# Patient Record
Sex: Male | Born: 1950 | Race: White | Hispanic: No | Marital: Married | Smoking: Never smoker
Health system: Southern US, Community
[De-identification: ages and names within clinical notes are randomized; demographics above are authoritative.]

## PROBLEM LIST (undated history)

## (undated) DIAGNOSIS — E669 Obesity, unspecified: Secondary | ICD-10-CM

## (undated) DIAGNOSIS — H9190 Unspecified hearing loss, unspecified ear: Secondary | ICD-10-CM

## (undated) DIAGNOSIS — I10 Essential (primary) hypertension: Secondary | ICD-10-CM

## (undated) DIAGNOSIS — G609 Hereditary and idiopathic neuropathy, unspecified: Secondary | ICD-10-CM

## (undated) DIAGNOSIS — I219 Acute myocardial infarction, unspecified: Secondary | ICD-10-CM

## (undated) DIAGNOSIS — G894 Chronic pain syndrome: Secondary | ICD-10-CM

## (undated) DIAGNOSIS — E119 Type 2 diabetes mellitus without complications: Secondary | ICD-10-CM

## (undated) DIAGNOSIS — E785 Hyperlipidemia, unspecified: Secondary | ICD-10-CM

## (undated) DIAGNOSIS — F411 Generalized anxiety disorder: Secondary | ICD-10-CM

## (undated) DIAGNOSIS — G4733 Obstructive sleep apnea (adult) (pediatric): Secondary | ICD-10-CM

## (undated) DIAGNOSIS — I25119 Atherosclerotic heart disease of native coronary artery with unspecified angina pectoris: Secondary | ICD-10-CM

## (undated) DIAGNOSIS — K5900 Constipation, unspecified: Secondary | ICD-10-CM

## (undated) HISTORY — DX: Hereditary and idiopathic neuropathy, unspecified: G60.9

## (undated) HISTORY — PX: HAND SURGERY: SHX662

## (undated) HISTORY — DX: Generalized anxiety disorder: F41.1

## (undated) HISTORY — DX: Essential (primary) hypertension: I10

## (undated) HISTORY — PX: EYE SURGERY: SHX253

## (undated) HISTORY — DX: Unspecified hearing loss, unspecified ear: H91.90

## (undated) HISTORY — DX: Acute myocardial infarction, unspecified: I21.9

## (undated) HISTORY — DX: Atherosclerotic heart disease of native coronary artery with unspecified angina pectoris: I25.119

## (undated) HISTORY — DX: Hyperlipidemia, unspecified: E78.5

## (undated) HISTORY — DX: Type 2 diabetes mellitus without complications: E11.9

## (undated) HISTORY — PX: SHOULDER SURGERY: SHX246

## (undated) HISTORY — PX: CARPAL TUNNEL RELEASE: SHX101

## (undated) HISTORY — DX: Obstructive sleep apnea (adult) (pediatric): G47.33

## (undated) HISTORY — DX: Constipation, unspecified: K59.00

## (undated) HISTORY — DX: Obesity, unspecified: E66.9

## (undated) HISTORY — DX: Chronic pain syndrome: G89.4

---

## 1999-08-13 ENCOUNTER — Emergency Department (HOSPITAL_COMMUNITY): Admission: EM | Admit: 1999-08-13 | Discharge: 1999-08-13 | Payer: Self-pay | Admitting: Emergency Medicine

## 1999-08-13 ENCOUNTER — Encounter: Payer: Self-pay | Admitting: Emergency Medicine

## 2000-08-25 ENCOUNTER — Inpatient Hospital Stay (HOSPITAL_COMMUNITY): Admission: EM | Admit: 2000-08-25 | Discharge: 2000-09-01 | Payer: Self-pay | Admitting: Emergency Medicine

## 2004-05-17 ENCOUNTER — Encounter: Admission: RE | Admit: 2004-05-17 | Discharge: 2004-05-17 | Payer: Self-pay | Admitting: Urology

## 2004-05-18 ENCOUNTER — Ambulatory Visit (HOSPITAL_COMMUNITY): Admission: RE | Admit: 2004-05-18 | Discharge: 2004-05-18 | Payer: Self-pay | Admitting: Urology

## 2004-05-18 ENCOUNTER — Ambulatory Visit (HOSPITAL_BASED_OUTPATIENT_CLINIC_OR_DEPARTMENT_OTHER): Admission: RE | Admit: 2004-05-18 | Discharge: 2004-05-18 | Payer: Self-pay | Admitting: Urology

## 2004-05-23 ENCOUNTER — Observation Stay (HOSPITAL_COMMUNITY): Admission: EM | Admit: 2004-05-23 | Discharge: 2004-05-24 | Payer: Self-pay | Admitting: Urology

## 2005-03-05 IMAGING — CR DG CHEST 2V
2 series · 2 of 2 positions shown · non-contrast
Comparison: none

CLINICAL DATA: Pre-op respiratory exam for surgery for phimosis. 
 CHEST X-RAY: 
 Two views of the chest show the lungs to be clear.  Slightly prominent perihilar markings are noted.  Mild cardiomegaly is present.  No bony abnormality is seen.

[view not recorded (1 of 2)]
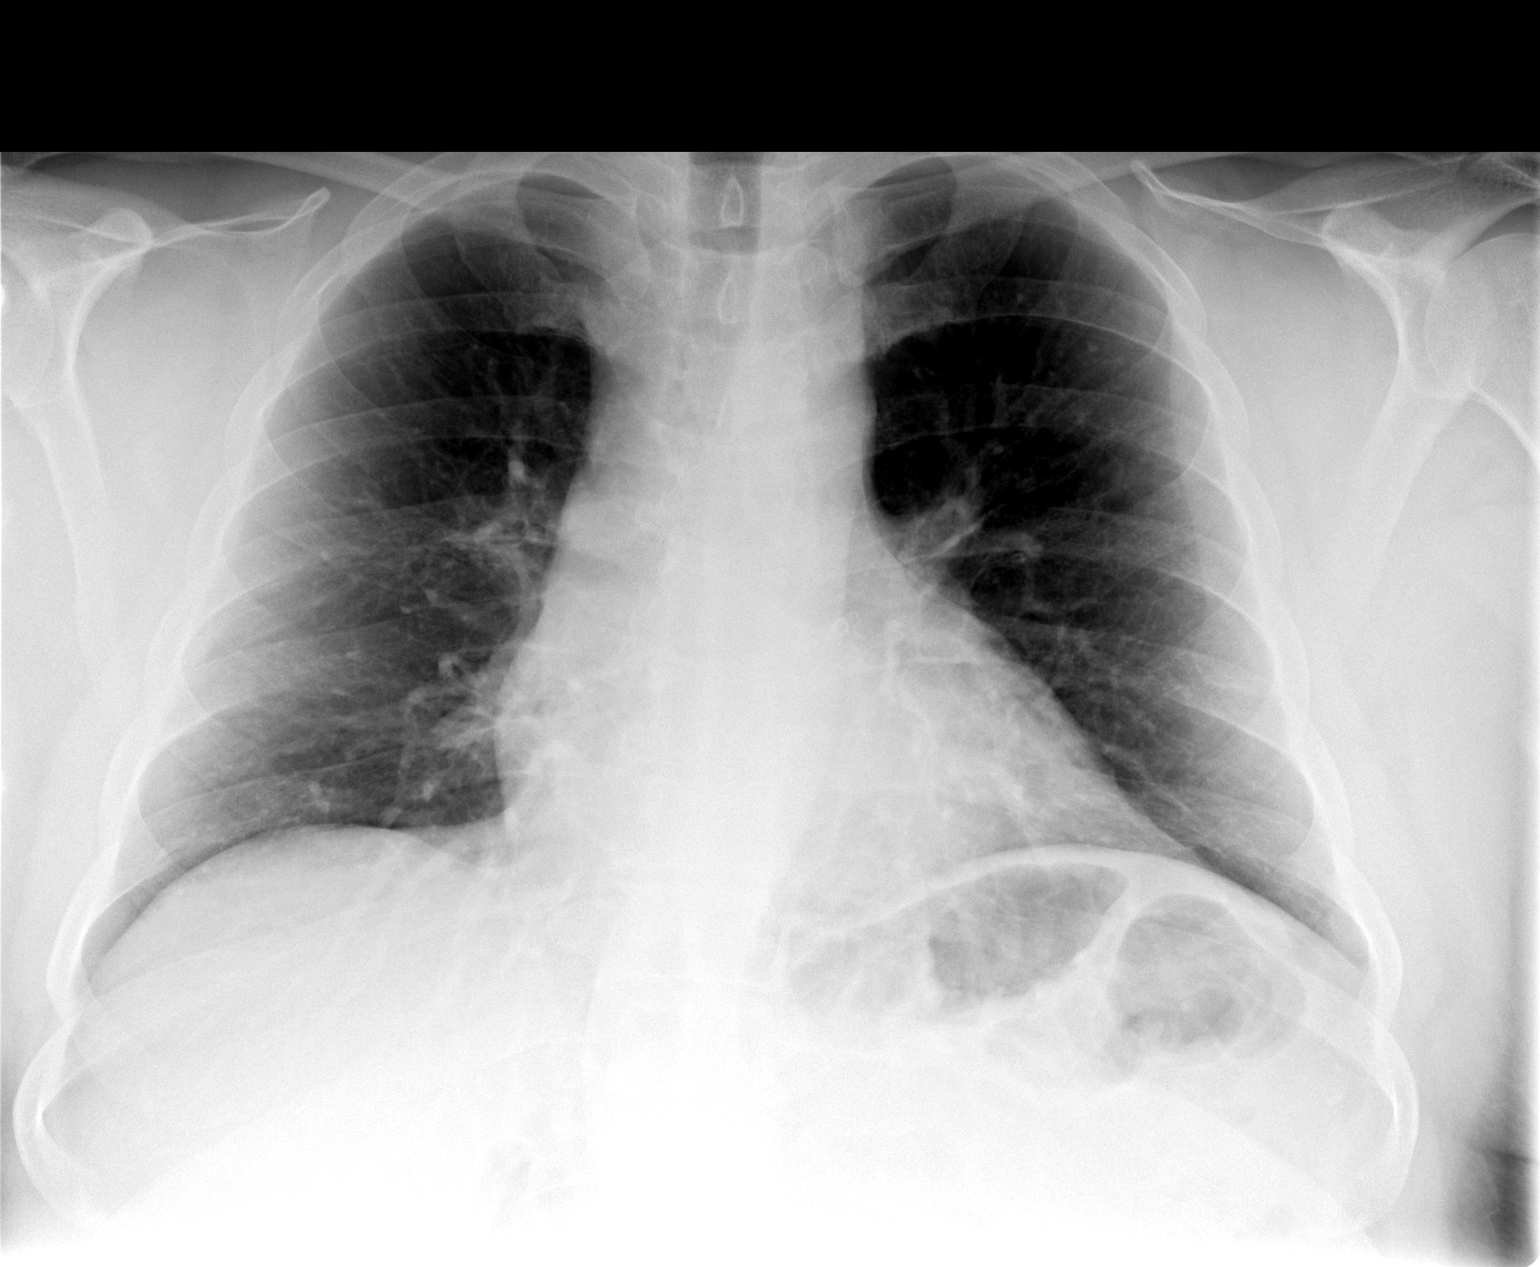

[view not recorded (2 of 2)]
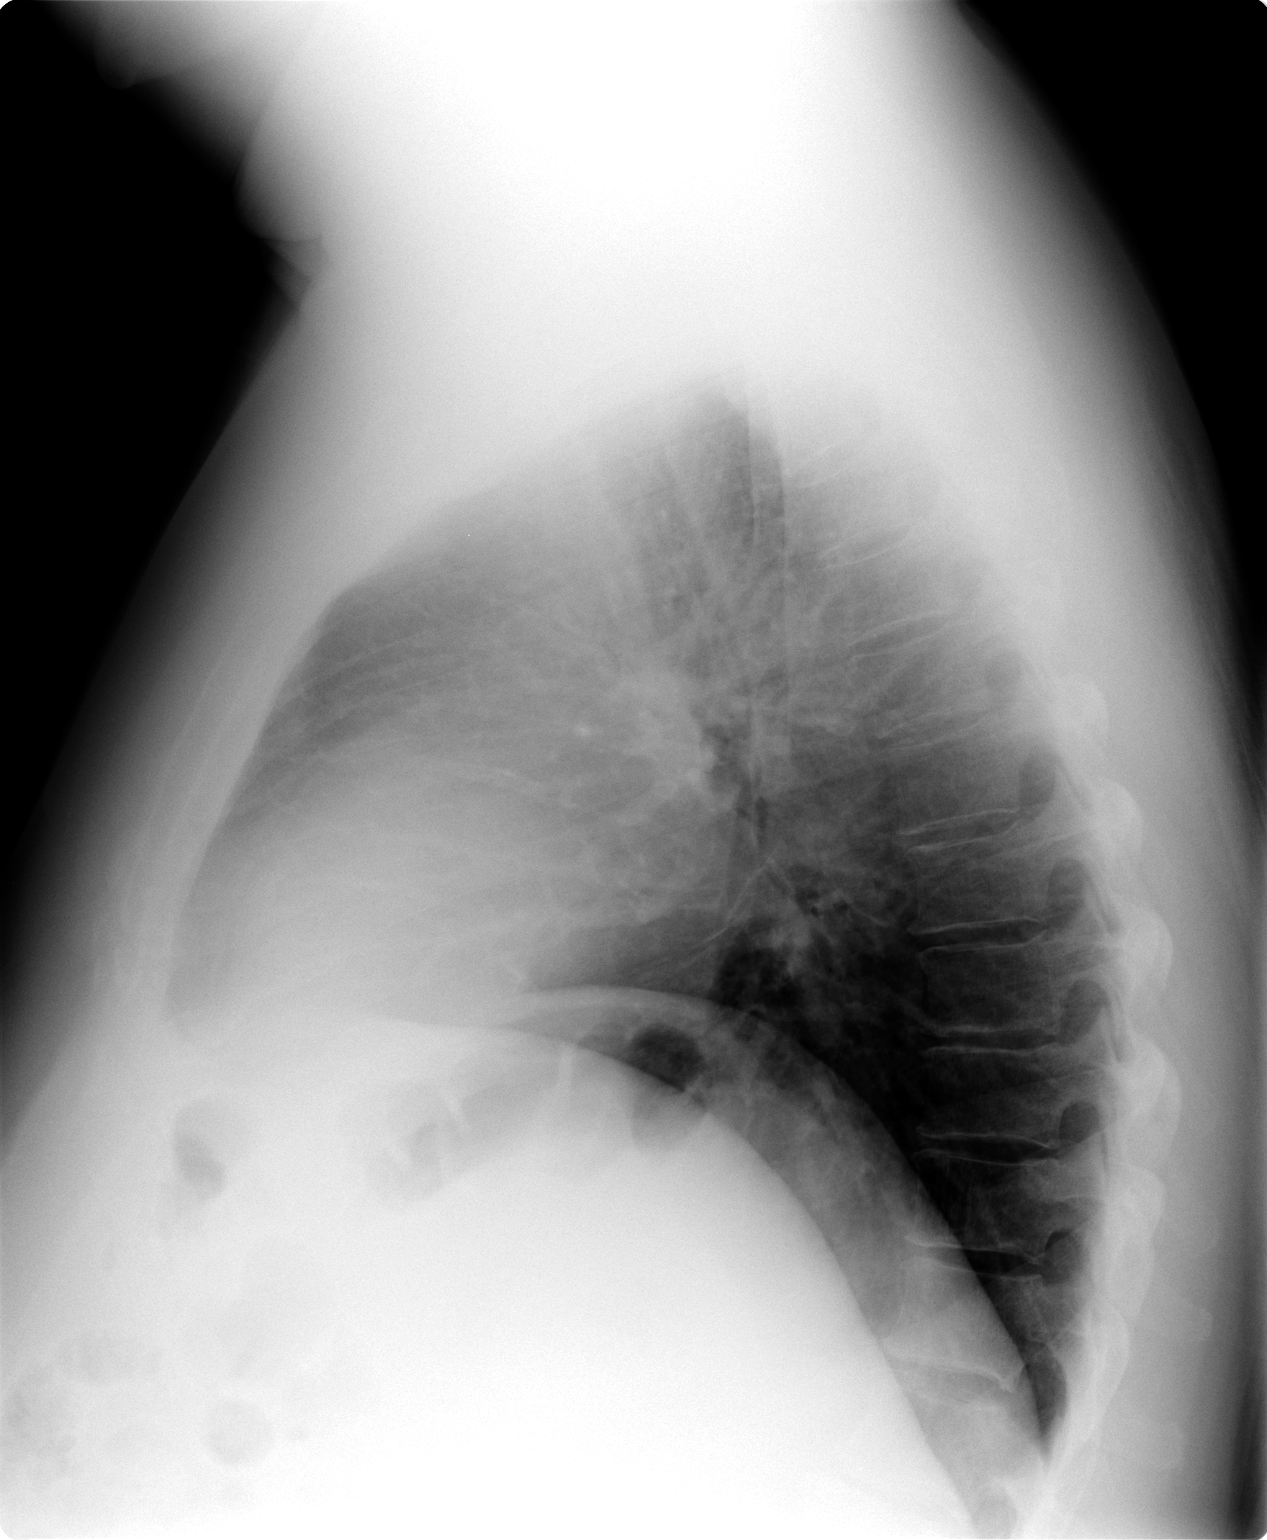

[2 of 2 positions shown; findings below may reference images not displayed]

IMPRESSION: Mild cardiomegaly.  No active lung disease.

## 2008-03-11 ENCOUNTER — Ambulatory Visit (HOSPITAL_BASED_OUTPATIENT_CLINIC_OR_DEPARTMENT_OTHER): Admission: RE | Admit: 2008-03-11 | Discharge: 2008-03-12 | Payer: Self-pay | Admitting: Professional

## 2010-08-17 DIAGNOSIS — K5900 Constipation, unspecified: Secondary | ICD-10-CM

## 2010-08-17 DIAGNOSIS — I25119 Atherosclerotic heart disease of native coronary artery with unspecified angina pectoris: Secondary | ICD-10-CM

## 2010-08-17 DIAGNOSIS — F411 Generalized anxiety disorder: Secondary | ICD-10-CM

## 2010-08-17 DIAGNOSIS — H9190 Unspecified hearing loss, unspecified ear: Secondary | ICD-10-CM | POA: Insufficient documentation

## 2010-08-17 DIAGNOSIS — G609 Hereditary and idiopathic neuropathy, unspecified: Secondary | ICD-10-CM

## 2010-08-17 DIAGNOSIS — K5903 Drug induced constipation: Secondary | ICD-10-CM

## 2010-08-17 DIAGNOSIS — I10 Essential (primary) hypertension: Secondary | ICD-10-CM

## 2010-08-17 HISTORY — DX: Hereditary and idiopathic neuropathy, unspecified: G60.9

## 2010-08-17 HISTORY — DX: Essential (primary) hypertension: I10

## 2010-08-17 HISTORY — DX: Drug induced constipation: K59.03

## 2010-08-17 HISTORY — DX: Constipation, unspecified: K59.00

## 2010-08-17 HISTORY — DX: Unspecified hearing loss, unspecified ear: H91.90

## 2010-08-17 HISTORY — DX: Atherosclerotic heart disease of native coronary artery with unspecified angina pectoris: I25.119

## 2010-08-17 HISTORY — DX: Generalized anxiety disorder: F41.1

## 2011-03-21 NOTE — Op Note (Signed)
NAME:  Danny Parrish, Danny Parrish NO.:  [id number]   MEDICAL RECORD NO.:  [id number]          PATIENT TYPE:  AMB   LOCATION:  [facility]                          FACILITY:  [facility]   PHYSICIAN:  Harvie Junior, M.D.   DATE OF BIRTH:  09-03-1951   DATE OF PROCEDURE:  03/11/2008  DATE OF DISCHARGE:                               OPERATIVE REPORT   PREOPERATIVE DIAGNOSES:  1. Impingement  2. Acromioclavicular joint arthritis.   POSTOPERATIVE DIAGNOSES:  1. Impingement.  2. Acromioclavicular joint arthritis.  3. Significant biceps tendon tendinitis.   OPERATIVE PROCEDURE:  1. Arthroscopic subacromial decompression for lateral and posterior      compartment.  2. Arthroscopic distal clavicle resection from an anterior      compartment.  3. Arthroscopic debridement of the biceps tendon within the      glenohumeral joint with debridement of the superior labrum as well.   SURGEON:  Harvie Junior, MD   ASSISTANT:  Marshia Ly, P.A.   ANESTHESIA:  General.   BRIEF HISTORY:  Danny Parrish is a 60 year old male with a long history  of having had significant right shoulder pain treated conservatively for  a period of time.  MRI was obtained, which showed that he had related  impingement as well as AC joint arthritis.  We talked about treatment  options and also we felt that with improvement with injection and  failure of conservative care that ultimately he was necessary to be  taken to the operating room for arthroscopic subacromial decompression,  distal clavicle resection and evaluation of rotator cuff.  He was  brought to the operating room for this procedure.   PROCEDURE:  The patient was brought to the operating room.  After  adequate anesthesia was obtained with general anesthetic, the patient  was placed supine on the operating table.  He was then moved to a beach-  chair position.  All bony prominences were well padded. Attention was  then turned to the right shoulder where  routine prep and drape was  undertaken.  At this point, the patient underwent an arthroscopic  examination of glenohumeral joint, which showed that there was some  significant fray on the undersurface of the biceps tendon.  This was  debrided back to a bleeding bed.  There was about 75-80% of the biceps  tendon still functioning and it certainly was well anchored.  We went up  and did a debridement of the superior labrum as well especially  superoposterior.  Anterior labrum was somewhat deficient.  Once we  debrided this, the biceps tendon could not be pushed down into the  joint.  It was well anchored, debated back and forth of a biceps  tenodesis, but felt that this certainly was anchoring and performing a  functional depression of the humeral head.  Attention went up to the  rotator cuff, then under surface which looked pristine from within the  glenohumeral joint.  Attention was turned out of the glenohumeral joint  into the subacromial space where the arthroscopic subacromial  decompression was undertaken from a lateral and posterior compartment.  Attention was then turned to the anterior compartment where the distal  clavicle was excised that were about 20 mm.  Once this distal clavicle  was excised, the end of the clavicle was burned with an ArthroCare wand  to try to stop any formation of osteoblast at this point.  Once this was  completed, the rotator cuff was evaluated from the top side thoroughly  debrided.  A total bursectomy was performed and once that was completed  the shoulder was copiously and thoroughly irrigated and suction dried.  The rotator cuff was probed at length from the top side.  There was  really no significant evidence of rotator cuff tear from the top side.  At this point, the patient was taken to recovery room and was noted to  be in satisfactory condition.  Estimated blood loss for this procedure  was none.      Harvie Junior, M.D.  Electronically  Signed     JLG/[id number]  D:  03/11/2008  T:  03/12/2008  Job:  [id number]

## 2011-03-24 NOTE — Discharge Summary (Signed)
[place]. [facility]  Patient:    Danny Parrish, Danny Parrish                      MRN: [id number] Adm. Date:  [id number] Disc. Date: 09/01/00 Attending:  Ronaldo Parrish Dictator:   Danny Parrish, P.A.C. CC:         Danny Parrish, M.D.  Danny Parrish, [facility]   Discharge Summary  DATE OF BIRTH:  [birth year]  HISTORY OF PRESENT ILLNESS:  Danny Parrish is a 60 year old white male who was transferred from [facility].  He developed onset of chest discomfort at approximately 11 a.m. on the morning of transfer.  He came to the emergency room and was transferred to [facility] for further evaluation.  His EKG had several leads with ST segment depression.  He has a history of diabetes, hypertension, hyperlipidemia, obesity, family history, and prior tobacco use.  LABORATORY DATA:  At [facility], sodium was 139, potassium 3.2, BUN 15, creatinine 0.9, glucose 112.  Initial CK was 131.  H&H was 15.5 and 45.9, normal indices, platelets 424, WBC 15.2.  PT 11.3, PTT 19.6.  Troponin was 0.0.  On transfer initial CK was 2443 with MB of 518 and relative index of 21.2.  Subsequent enzymes were declining.  Subsequent chemistries and hematologies were essentially unremarkable.  EKGs at [facility] showed normal sinus rhythm, ST depression in V2 through V6.  T wave inversion in 2 and aVF.  Subsequent EKGs at [facility] showed anterior ST segment elevation.  HOSPITAL COURSE:  Danny Parrish was brought to [facility] for cardiac catheterization.  This was performed on October 20, by Dr. Riley Parrish. According to the progress notes, he had a 40% proximal RCA, 30% distal RCA, 80 to 90% distal circumflex, 50% distal circumflex branch, 75% proximal ramus, 30% proximal LAD, 100% proximal LAD, 100% diagonal 1, EF 40% with anterior apical hypokinesis.  Dr. Riley Parrish performed angioplasty stenting to the LAD and angioplasty to the diagonal  utilizing the stent.  Postprocedure, he did have some AIVR and nonsustained ventricular tachycardia.  Diabetes coordinator and cardiac rehabilitation participated in his care.  On October 22, he was continuing to have some nonsustained ventricular tachycardia.  It was noted that his blood pressure remained in the 90s and he would complain of feeling lightheaded.  It was felt that they should avoid an ACE inhibitor secondary to his hypotension.  On October 23, the patient again complained of chest pain. EKGs did not show any acute changes and vital signs were stable.  With his recurring chest discomfort, recatheterization was performed on October 25. His stent was patent.  The proximal LAD had a 25% lesion.  Dr. Gerri Parrish performed angioplasty on his distal circumflex reducing a 95% lesion to 25% and a 75% lesion to 25%.  He maintained on Integrilin and heparin without difficulty.  By October 27, he was not having any further chest discomfort. He did note that during his admission he continued to have headaches.  His Midrin had been discontinued secondary to adverse effects with coronary artery disease and he was using Percocet for his headaches.  By October 27, Dr. Dietrich Parrish, after reviewing the chart felt that he could be discharged home with the diagnosis of acute anterior myocardial infarction, status post angioplasty of the LAD, angioplasty of the diagonal, AIVR, nonsustained ventricular tachycardia, hypotension, recurring unstable angina, recatheterization resulted in angioplasty of the distal circumflex, history of hypertension, hyperlipidemia, diabetes,  and remote tobacco abuse.  DISPOSITION:  He was discharged home.  He was instructed not to take his Norvasc, hydrochloride, or Midrin.  DISCHARGE MEDICATIONS: 1. Plavix 75 mg q.d. for four weeks. 2. Lopressor 50 mg 1/2 tablet b.i.d. 3. Lipitor 20 mg q.h.s. 4. Asked to resume his Glucovance three times a day on Sunday. 5. Zoloft 50  mg q.d. 6. Ambien 10 mg p.r.n. at bedtime. 7. Tylenol as directed. 8. Coated aspirin 325 mg q.d. 9. Sublingual nitroglycerin as needed.  He was given a prescription for 20 Percocet and instructed one to two tablets every four to six hours for headaches.  He was advised no lifting, driving, heavy exertion, working, or sex until he sees Danny Parrish.  Maintain low salt, fat, and cholesterol, diabetic diet.  If he had any problems with his catheterization site, he was asked to call us immediately.  He was also asked to follow up with Danny Parrish for headaches and to call Danny Parrish on Monday to arrange a follow-up appointment.  He also has an appointment with the diabetes program in [place] on November 12, from 4 to 7 p.m. DD:  09/01/00 TD:  09/01/00 Job: [id number]

## 2011-03-24 NOTE — Cardiovascular Report (Signed)
[place]. [facility]  Patient:    Danny Parrish, Danny Parrish                      MRN: [id number] Proc. Date: 08/25/00 Adm. Date:  [id number] Attending:  Ronaldo Miyamoto CC:         CV Laboratory  Earl Many, M.D.  Ruffin Pyo, M.D., [facility]   Cardiac Catheterization  INDICATIONS:  The patient is a 60 year old with a history of hyperlipidemia. He also apparently has diabetes but it is unclear whether he has diabetes insipidus or diabetes mellitus.  He currently presents with an acute coronary syndrome with diffuse ST depression.  He was seen by Dr. Sherril Croon and referred for further evaluation.  PROCEDURES: 1. Left heart catheterization. 2. Selective coronary arteriography. 3. Selective left ventriculography. 4. Percutaneous transluminal coronary angioplasty and stenting of the    left anterior descending artery and percutaneous transluminal coronary    angioplasty of the diagonal artery.  DESCRIPTION OF PROCEDURE:  The patient was brought to the catheterization lab and prepped and draped in the usual fashion.  Through an anterior puncture the right femoral artery was easily entered.  A 7 French sheath was placed.  Views of the left and right coronary arteries were obtained in multiple angiographic projections.  Ventriculography was performed in the RAO projection.  The patient was already on a heparin drip with Aggrastat.  He was given appropriate doses of heparin to prolong the ACT between 200 and 300. Following this we were able to cross the left anterior descending artery with a Hi-Torque Floppy wire.  The lesion was dilated with multiple balloons.  We ultimately placed a 24 mm length AVE 3 mm S7 stent.  There was marked improvement in the appearance of the artery with TIMI-3 flow.  There was reduced flow in the diagonal branch and we were able to wire the diagonal branch through the stent.  This was dilated with a 2.25 CrossSail  balloon. There was reestablishment of flow in the diagonal with about 40% residual narrowing.  The patient was clinically improved.  All catheters were subsequently removed and the femoral sheath sewn in place and the patient taken to the holding area in satisfactory clinical condition.  HEMODYNAMIC DATA:  The central aortic pressure was 116/86. LV pressure 126/39. There was no gradient on pullback across the aortic valve.  ANGIOGRAPHIC DATA:  The left main coronary artery is free of critical disease.  The LAD has a proximal 30% stenosis and then it is totally occluded. Following reperfusion therapy, this total occlusion extended over some length which demonstrated a substantial amount of thrombus.  Following ballooning and stenting this area was reduced to 0% residual luminal narrowing.  The diagonal was opened at the beginning with initial balloon dilatation, but then reclosed after stent placement.  Following balloon dilatation, again there was about a 40% residual narrowing in the proximal vessel.  The distal vessel was relatively small.  There is a large circumflex ramus intermedius vessel that has a 75% proximal segmental stenosis extending just beyond the ostium.  The AV circumflex divides distally into two small branches.  The superior branch has about 50% mid narrowing.  The inferior branch has 90% narrowing but is a very small vessel.  The right coronary artery is a modest size vessel.  There is about 40% proximal narrowing and then a 30% mid to distal area of plaquing prior to the vessel bifurcation.  It provides  an acute marginal branch in the AV portion of the right coronary artery, both of which are small to moderate in size.  VENTRICULOGRAPHY:  Ventriculography in the RAO projection reveals akinesis of the mid and distal anterolateral wall and apex.  Ejection fraction would be estimated at 40-45%.  CONCLUSIONS: 1. Acute myocardial infarction secondary to occlusion  of the left anterior    descending artery. 2. Successful percutaneous stenting of the left anterior descending artery and    balloon dilatation of the diagonal. 3. Residual 75% stenosis of the circumflex ramus intermedius.  DISPOSITION:  The patient will be treated medically.  He will follow up with Dr. Sherril Croon for cardiologic care. DD:  08/25/00 TD:  08/27/00 Job: [id number]

## 2011-03-24 NOTE — Cardiovascular Report (Signed)
[place]. [facility]  Patient:    Danny Parrish, Danny Parrish                      MRN: [id number] Proc. Date: 08/30/00 Adm. Date:  [id number] Attending:  Ronaldo Miyamoto CC:         Simone Curia, M.D., [facility]  Earl Many, M.D., Jani Files D. Riley Kill, M.D. [facility]  Cardiac Catheterization Laboratory   Cardiac Catheterization  PROCEDURES PERFORMED: 1. Left heart catheterization with coronary angiography and left    ventriculography. 2. Percutaneous transluminal coronary angioplasty of the distal left    circumflex and third obtuse marginal branch.  INDICATIONS:  Danny Parrish is a 60 year old male who presented several days ago with an acute anterior myocardial infarction.  He underwent emergent PTCA with stent placement in the proximal LAD.  This stent extended across the origin of a diagonal branch and treated with PTCA.  The patient has been having recurrent angina type chest pain and was referred for a repeat catheterization.  CATHETERIZATION PROCEDURE:  A 6 French sheath was placed in the left femoral artery.  Standard Judkins 6 French catheters were utilized.  Contrast was Omnipaque.  There were no complications.  CATHETERIZATION RESULTS:  HEMODYNAMICS:  Left ventricular pressure 112/18.  Aortic pressure 110/74.  Left ventriculography was not performed as it was recently performed on his diagnostic catheterization prior to his last PTCA.  CORONARY ARTERIOGRAPHY:  (Right dominant).  Left main:  Left main has a distal 20% stenosis with a mild area of ulceration.  Left anterior descending:  The left anterior descending has a patent stenting from the proximal to the mid vessel across the origin of the first diagonal. There is 0% stenosis within the stented area.  The first diagonal itself is also patent at its origin.  In the proximal body of the diagonal is a 25% followed by a 40% stenosis.  This first diagonal was normal in size.  There  is a second diagonal arising from the mid LAD.  Further down in the distal LAD is a 30% stenosis.  In the apical LAD is an 80% followed by a 95%, followed by a 90% stenosis, followed by moderate diffuse disease.  Left circumflex:  The left circumflex has a 30% stenosis in the mid vessel. In the distal circumflex is a 95% stenosis just after the origin of OM-2 and proximal to OM-3.  The circumflex gives rise to a large first marginal branch which has a 60-70% stenosis at its origin.  There is a normal sized second marginal branch with a 20% stenosis.  The third marginal branch has a 70% stenosis.  The third marginal branch is normal in size.  Right coronary artery:  The right coronary artery has a proximal 30% stenosis and a mid less than 20%.  The distal right coronary artery gives rise to a small posterior descending artery and a large acute marginal branch which supplies the distal portion of the inferior septum.  IMPRESSION: 1. Left ventricular end-diastolic pressure is minimally elevated. 2. Two-vessel coronary artery disease as described.  There is a patent    stent in the proximal and mid left anterior descending with a patent    diagonal branch arising from within the stented area.  There is diffuse    disease of the apical left anterior descending which is not amenable to    percutaneous revascularization.  There is also a significant disease in    the  distal left circumflex.  This may represent a culprit for the patients    recurrent anginal pain.  PLAN:  Percutaneous intervention of the distal left circumflex.  See below.  PERCUTANEOUS TRANSLUMINAL CORONARY ANGIOPLASTY PROCEDURE:  Following completion of diagnostic catheterization and review of the images with Dr. Riley Kill and Dr. Chales Abrahams we opted to proceed with percutaneous intervention of the distal left circumflex.  The preexisting 6 French sheath in the left femoral artery is exchanged over a wire for a 7 [place] sheath.   We used a 7 [place] Voda left 3.5 guiding catheter and a BMW wire.  PTCA of the distal left circumflex was performed with a 2.0 x 15 mm Maverick balloon.  This as positioned across the 95% stenosis in the distal circumflex and inflated to 10 atmospheres for 2 minutes, then 14 atmospheres for 3 minutes.  We then advanced this balloon slightly more distally and placed it across the 70% stenosis in the third marginal branch and inflated it to 5 atmospheres for 2 minutes.  We then pulled the balloon back again across the lesion in the distal circumflex and inflated it to 15 atmospheres for 1 minute.  Final angiographic images revealed patency of the left circumflex and third marginal branch with 25% residual stenosis at both sites.  There was TIMI-3 flow into the distal vessel.  COMPLICATIONS:  None.  RESULTS:  Successful percutaneous transluminal coronary angioplasty of the distal left circumflex reducing a 95% stenosis to 25% residual with TIMI-3 flow.  A 70% stenosis in the third obtuse marginal branch was also reduced to 25% residual with TIMI-3 flow.  PLAN:  Integrilin will be continued for 18 hours.  Would recommend additional observation in the hospital for another 48 hours to ensure the patient does not have recurrent chest discomfort. DD:  08/30/00 TD:  08/30/00 Job: [id number]

## 2011-03-24 NOTE — Op Note (Signed)
NAME:  Danny Parrish, Danny Parrish                         ACCOUNT NO.:  [id number]   MEDICAL RECORD NO.:  [id number]                   PATIENT TYPE:  AMB   LOCATION:  [facility]                                 FACILITY:  [facility]   PHYSICIAN:  Maretta Bees. Vonita Moss, M.D.             DATE OF BIRTH:  1951-06-09   DATE OF PROCEDURE:  05/18/2004  DATE OF DISCHARGE:                                 OPERATIVE REPORT   PREOPERATIVE DIAGNOSIS:  Phimosis.   POSTOPERATIVE DIAGNOSIS:  Phimosis.   PROCEDURE:  Circumcision.   SURGEON:  Maretta Bees. Vonita Moss, M.D.   ANESTHESIA:  General.   INDICATIONS:  This 60 year old white male has had a progressive problem  drawing back his foreskin.  He was counseled about going ahead with a  circumcision.  I did get a BMET that showed a normal blood sugar because of  a concern about diabetes in view of phimosis.   PROCEDURE:  The patient was brought to the operating room and placed in  supine position.  The external genitalia were prepped and draped in the  usual fashion.  A circumcision was performed using the sleeve technique.  There were a few large veins that were ligated with 3-0 catgut ties.  A few  other small bleeders were coagulated with the hand-held cautery unit.  At  this point there was good hemostasis and the proximal site of the incision  was injected with 0.25% Marcaine.  The distal edge of the penile skin of the  shaft was reapproximated to the residual edge of mucosal foreskin at 3, 6,  9, and 12 o'clock, and the frenular area was closed with running 4-0 chromic  catgut.  The running 4-0 chromic was placed between the quadrant sutures and  the wound was cleaned and dressed with Vaseline gauze, dry sterile gauze,  and Coban dressing.  He tolerated the procedure well.  Estimated blood loss  was 10 mL.  He was taken to the recovery room in good condition.                                               Maretta Bees. Vonita Moss, M.D.    LJP/MEDQ  D:  05/18/2004  T:   05/18/2004  Job:  [id number]

## 2011-08-02 LAB — BASIC METABOLIC PANEL
BUN: 15
CO2: 25
Calcium: 10
Chloride: 104
Creatinine, Ser: 0.69
GFR calc Af Amer: >60
GFR calc non Af Amer: >60
Glucose, Bld: 169 — ABNORMAL HIGH
Potassium: 5
Sodium: 138

## 2011-08-02 LAB — POCT HEMOGLOBIN-HEMACUE: Hemoglobin: 15.2

## 2014-09-04 ENCOUNTER — Ambulatory Visit: Payer: Self-pay

## 2014-10-05 ENCOUNTER — Ambulatory Visit (INDEPENDENT_AMBULATORY_CARE_PROVIDER_SITE_OTHER): Payer: Medicare Other

## 2014-10-05 VITALS — BP 141/79 | HR 86 | Resp 12

## 2014-10-05 DIAGNOSIS — E1140 Type 2 diabetes mellitus with diabetic neuropathy, unspecified: Secondary | ICD-10-CM

## 2014-10-05 DIAGNOSIS — M2040 Other hammer toe(s) (acquired), unspecified foot: Secondary | ICD-10-CM

## 2014-10-05 NOTE — Patient Instructions (Signed)
Diabetes and Foot Care Diabetes may cause you to have problems because of poor blood supply (circulation) to your feet and legs. This may cause the skin on your feet to become thinner, break easier, and heal more slowly. Your skin may become dry, and the skin may peel and crack. You may also have nerve damage in your legs and feet causing decreased feeling in them. You may not notice minor injuries to your feet that could lead to infections or more serious problems. Taking care of your feet is one of the most important things you can do for yourself.  HOME CARE INSTRUCTIONS  Wear shoes at all times, even in the house. Do not go barefoot. Bare feet are easily injured.  Check your feet daily for blisters, cuts, and redness. If you cannot see the bottom of your feet, use a mirror or ask someone for help.  Wash your feet with warm water (do not use hot water) and mild soap. Then pat your feet and the areas between your toes until they are completely dry. Do not soak your feet as this can dry your skin.  Apply a moisturizing lotion or petroleum jelly (that does not contain alcohol and is unscented) to the skin on your feet and to dry, brittle toenails. Do not apply lotion between your toes.  Trim your toenails straight across. Do not dig under them or around the cuticle. File the edges of your nails with an emery board or nail file.  Do not cut corns or calluses or try to remove them with medicine.  Wear clean socks or stockings every day. Make sure they are not too tight. Do not wear knee-high stockings since they may decrease blood flow to your legs.  Wear shoes that fit properly and have enough cushioning. To break in new shoes, wear them for just a few hours a day. This prevents you from injuring your feet. Always look in your shoes before you put them on to be sure there are no objects inside.  Do not cross your legs. This may decrease the blood flow to your feet.  If you find a minor scrape,  cut, or break in the skin on your feet, keep it and the skin around it clean and dry. These areas may be cleansed with mild soap and water. Do not cleanse the area with peroxide, alcohol, or iodine.  When you remove an adhesive bandage, be sure not to damage the skin around it.  If you have a wound, look at it several times a day to make sure it is healing.  Do not use heating pads or hot water bottles. They may burn your skin. If you have lost feeling in your feet or legs, you may not know it is happening until it is too late.  Make sure your health care provider performs a complete foot exam at least annually or more often if you have foot problems. Report any cuts, sores, or bruises to your health care provider immediately. SEEK MEDICAL CARE IF:   You have an injury that is not healing.  You have cuts or breaks in the skin.  You have an ingrown nail.  You notice redness on your legs or feet.  You feel burning or tingling in your legs or feet.  You have pain or cramps in your legs and feet.  Your legs or feet are numb.  Your feet always feel cold. SEEK IMMEDIATE MEDICAL CARE IF:   There is increasing redness,   swelling, or pain in or around a wound.  There is a red line that goes up your leg.  Pus is coming from a wound.  You develop a fever or as directed by your health care provider.  You notice a bad smell coming from an ulcer or wound. Document Released: 10/20/2000 Document Revised: 06/25/2013 Document Reviewed: 04/01/2013 ExitCare Patient Information 2015 [employer]. This information is not intended to replace advice given to you by your health care provider. Make sure you discuss any questions you have with your health care provider.  

## 2014-10-05 NOTE — Progress Notes (Signed)
   Subjective:    Patient ID: Danny Parrish, male    DOB: 07-19-1951, 63 y.o.   MRN: [id number]  HPI I AM HERE TO GET MY DIABETIC SHOES AND TO HAVE MY TOENAILS TRIMMED UP    Review of Systems  HENT: Positive for hearing loss.        Objective:   Physical Exam Lower extremity objective findings as follows is a 63 year old male well-developed well-nourished oriented 3 presents at this time for diabetic foot exam and new prescription for diabetic shoes old shoes are worn and need replacing has been beneficial and preventing keratoses due to digital contractures currently no ulcers no history of ulcer no contractures are noted except for semirigid hammertoe deformities 2 through 5 bilateral. Pedal pulses are palpable DP plus one over 4 PT +2 over 4 bilateral capillary refill timed 3-4 seconds all digits. Epicritic and proprioceptive sensations intact and the dorsum hours decreased sensation to the plantar digits ball of foot and he'll bilateral intact sensation mid arch. There is normal plantar response and DTRs. Dermatologically skin color pigment normal hair growth absent nail somewhat criptotic although otherwise unremarkable no ingrown nails no signs of infection no discharge no friability not have any other point complains of pain no open wounds or ulcers are noted       Assessment & Plan:  Assessment this time his diabetes with history peripheral neuropathy history keratoses managed with diabetic shoes a prescription for  Diabetic shoes is issued for [employer] at this time patient we follow-up in 6-12 months for continued evaluation and my monitoring of diabetic foot issues. Patient's last A1c was 6.1 seems to be doing well well-managed diabetes without significant complication other than peripheral neuropathy.  Alvan Dame DPM

## 2015-10-07 ENCOUNTER — Ambulatory Visit: Payer: Medicare Other

## 2015-11-05 DIAGNOSIS — E785 Hyperlipidemia, unspecified: Secondary | ICD-10-CM | POA: Insufficient documentation

## 2015-11-05 DIAGNOSIS — G894 Chronic pain syndrome: Secondary | ICD-10-CM

## 2015-11-05 DIAGNOSIS — E119 Type 2 diabetes mellitus without complications: Secondary | ICD-10-CM

## 2015-11-05 DIAGNOSIS — E669 Obesity, unspecified: Secondary | ICD-10-CM

## 2015-11-05 DIAGNOSIS — I10 Essential (primary) hypertension: Secondary | ICD-10-CM

## 2015-11-05 DIAGNOSIS — G4733 Obstructive sleep apnea (adult) (pediatric): Secondary | ICD-10-CM

## 2015-11-05 HISTORY — DX: Obstructive sleep apnea (adult) (pediatric): G47.33

## 2015-11-05 HISTORY — DX: Chronic pain syndrome: G89.4

## 2015-11-05 HISTORY — DX: Morbid (severe) obesity due to excess calories: E66.01

## 2015-11-05 HISTORY — DX: Obesity, unspecified: E66.9

## 2015-11-05 HISTORY — DX: Essential (primary) hypertension: I10

## 2015-11-05 HISTORY — DX: Type 2 diabetes mellitus without complications: E11.9

## 2017-04-26 ENCOUNTER — Telehealth: Payer: Self-pay | Admitting: Cardiology

## 2017-04-26 NOTE — Telephone Encounter (Signed)
Closed Encounter  °

## 2017-05-08 ENCOUNTER — Other Ambulatory Visit: Payer: Self-pay

## 2017-05-08 MED ORDER — FENOFIBRATE 145 MG PO TABS: 145.0000 mg | ORAL_TABLET | Freq: Every day | ORAL | 2 refills | Status: DC

## 2017-05-28 ENCOUNTER — Ambulatory Visit: Payer: Self-pay | Admitting: Cardiology

## 2017-06-10 NOTE — Progress Notes (Signed)
Cardiology Office Note:    Date:  06/11/2017   ID:  Danny Parrish, DOB 03-13-1951, MRN [id number]  PCP:  Eda Paschal, NP  Cardiologist:  Norman Herrlich, MD    Referring MD: Eda Paschal, NP    ASSESSMENT:    1. Coronary artery disease involving native coronary artery of native heart with angina pectoris (HCC)   2. Essential hypertension   3. Hyperlipidemia, unspecified hyperlipidemia type    PLAN:    In order of problems listed above:  1. Stable continue current medical treatment including chronic dual antiplatelet beta blocker and high intensity statin. We discussed the potential ischemia evaluation he is asymptomatic comply with medications and prefers close clinical observation and we'll defer stress testing at this time 2. Stable blood pressure target continue current treatment including ACE inhibitor 3. Stable LDL at target continue his high intensity statin. All   Next appointment: 1 year   Medication Adjustments/Labs and Tests Ordered: Current medicines are reviewed at length with the patient today.  Concerns regarding medicines are outlined above.  Orders Placed This Encounter  Procedures  . EKG 12-Lead   No orders of the defined types were placed in this encounter.   Chief Complaint  Patient presents with  . Follow-up    for CAD Hypertension and Hyperlipidemia    History of Present Illness:    Danny Parrish is a 66 y.o. male with a hx of CAD, Dyslipidemia, HTN, and T2 DM   last seen 04/26/16. Overall he is done well he's had no angina exercise intolerance chest pain palpitation or syncope. Last evening he had an episode of bronchospasm relieved with his wife's bronchodilator. He is pleased with the quality of his life compliant with medications. Compliance with diet, lifestyle and medications: Yes Past Medical History:  Diagnosis Date  . Anxiety state 08/17/2010  . Benign essential hypertension 11/05/2015  . Constipation 08/17/2010  . Coronary  artery disease involving native coronary artery of native heart with angina pectoris (HCC) 08/17/2010   Overview:  Multivessel on cath 2003, treated medically  . Essential hypertension 08/17/2010  . Hearing loss 08/17/2010  . Heart attack (HCC)    2000  . Hereditary and idiopathic peripheral neuropathy 08/17/2010  . Hyperlipidemia   . Hypertension   . Obesity 11/05/2015  . OSA (obstructive sleep apnea) 11/05/2015  . Pain syndrome, chronic 11/05/2015  . Type 2 diabetes mellitus (HCC) 11/05/2015    Past Surgical History:  Procedure Laterality Date  . CARPAL TUNNEL RELEASE    . EYE SURGERY    . HAND SURGERY    . SHOULDER SURGERY      Current Medications: Current Meds  Medication Sig  . ACCU-CHEK COMPACT PLUS test strip   . ACCU-CHEK SOFTCLIX LANCETS lancets   . AMITIZA 24 MCG capsule Take 24 mcg by mouth daily.  Marland Kitchen aspirin EC 81 MG tablet Take 81 mg by mouth.  Marland Kitchen atorvastatin (LIPITOR) 40 MG tablet   . clopidogrel (PLAVIX) 75 MG tablet   . diclofenac sodium (VOLTAREN) 1 % GEL   . fenofibrate (TRICOR) 145 MG tablet Take 1 tablet (145 mg total) by mouth daily.  Marland Kitchen HYDROcodone-acetaminophen (NORCO/VICODIN) 5-325 MG per tablet   . Insulin Aspart Prot & Aspart (NOVOLOG 70/30 MIX) (70-30) 100 UNIT/ML Pen Inject into the skin.  Marland Kitchen LEVEMIR FLEXTOUCH 100 UNIT/ML Pen   . LORazepam (ATIVAN) 2 MG tablet   . metFORMIN (GLUCOPHAGE) 1000 MG tablet   . metoprolol (LOPRESSOR) 100 MG tablet   .  NITROSTAT 0.4 MG SL tablet   . NOVOLOG FLEXPEN 100 UNIT/ML FlexPen   . ramipril (ALTACE) 2.5 MG capsule   . VICTOZA 18 MG/3ML SOPN   . zolpidem (AMBIEN) 10 MG tablet   . [DISCONTINUED] cyanocobalamin (,VITAMIN B-12,) 1000 MCG/ML injection      Allergies:   Patient has no known allergies.   Social History   Social History  . Marital status: Married    Spouse name: N/A  . Number of children: N/A  . Years of education: N/A   Social History Main Topics  . Smoking status: Never Smoker  .  Smokeless tobacco: Never Used  . Alcohol use No  . Drug use: No  . Sexual activity: Not Asked   Other Topics Concern  . None   Social History Narrative  . None     Family History: The patient's  ROS:  He had wheezing last night relieved with his wifes brochodilator, Also coughs after meals. I advised him to discuss with his PCP re esophageal reflux. Please see the history of present illness.    All other systems reviewed and are negative.  EKGs/Labs/Other Studies Reviewed:    The following studies were reviewed today:  EKG:  EKG ordered today.  The ekg ordered today demonstrates Sinus rhythm normal  Recent Labs: A1C 6.3 % CMP normal 05/31/17 No results found for requested labs within last 8760 hours.  Recent Lipid Panel 05/31/17 Chol 148 LDL 81 HDL 41 No results found for: CHOL, TRIG, HDL, CHOLHDL, VLDL, LDLCALC, LDLDIRECT  Physical Exam:    VS:  BP 140/84   Pulse 88   Ht 5\' 9"  (1.753 m)   Wt 283 lb (128.4 kg)   SpO2 98%   BMI 41.79 kg/m     Wt Readings from Last 3 Encounters:  06/11/17 283 lb (128.4 kg)     GEN:  Well nourished, well developed in no acute distress HEENT: Normal NECK: No JVD; No carotid bruits LYMPHATICS: No lymphadenopathy CARDIAC: RRR, no murmurs, rubs, gallops RESPIRATORY:  Clear to auscultation without rales, wheezing or rhonchi  ABDOMEN: Soft, non-tender, non-distended MUSCULOSKELETAL:  No edema; No deformity  SKIN: Warm and dry NEUROLOGIC:  Alert and oriented x 3 PSYCHIATRIC:  Normal affect    Signed, Norman Herrlich, MD  06/11/2017 12:34 PM    [facility]

## 2017-06-11 ENCOUNTER — Ambulatory Visit (INDEPENDENT_AMBULATORY_CARE_PROVIDER_SITE_OTHER): Payer: Medicare HMO | Admitting: Cardiology

## 2017-06-11 ENCOUNTER — Encounter: Payer: Self-pay | Admitting: Cardiology

## 2017-06-11 VITALS — BP 140/84 | HR 88 | Ht 69.0 in | Wt 283.0 lb

## 2017-06-11 DIAGNOSIS — E785 Hyperlipidemia, unspecified: Secondary | ICD-10-CM

## 2017-06-11 DIAGNOSIS — I10 Essential (primary) hypertension: Secondary | ICD-10-CM | POA: Diagnosis not present

## 2017-06-11 DIAGNOSIS — I25119 Atherosclerotic heart disease of native coronary artery with unspecified angina pectoris: Secondary | ICD-10-CM | POA: Diagnosis not present

## 2017-06-11 NOTE — Patient Instructions (Signed)

## 2017-06-18 ENCOUNTER — Other Ambulatory Visit: Payer: Self-pay

## 2017-06-18 MED ORDER — NITROGLYCERIN 0.4 MG SL SUBL: 0.4000 mg | SUBLINGUAL_TABLET | SUBLINGUAL | 11 refills | Status: DC | PRN

## 2017-08-31 DIAGNOSIS — S39012A Strain of muscle, fascia and tendon of lower back, initial encounter: Secondary | ICD-10-CM

## 2017-08-31 HISTORY — DX: Strain of muscle, fascia and tendon of lower back, initial encounter: S39.012A

## 2017-11-30 DIAGNOSIS — Z79899 Other long term (current) drug therapy: Secondary | ICD-10-CM

## 2017-11-30 HISTORY — DX: Other long term (current) drug therapy: Z79.899

## 2018-08-12 ENCOUNTER — Telehealth: Payer: Self-pay

## 2018-08-12 MED ORDER — NITROGLYCERIN 0.4 MG SL SUBL: 0.4000 mg | SUBLINGUAL_TABLET | SUBLINGUAL | 11 refills | Status: DC | PRN

## 2018-08-12 NOTE — Telephone Encounter (Signed)
Rx sent to pharmacy as requested.

## 2019-04-04 ENCOUNTER — Other Ambulatory Visit: Payer: Self-pay | Admitting: Cardiology

## 2019-04-04 MED ORDER — NITROGLYCERIN 0.4 MG SL SUBL: 0.4000 mg | SUBLINGUAL_TABLET | SUBLINGUAL | 0 refills | Status: DC | PRN

## 2019-04-04 NOTE — Telephone Encounter (Signed)
°*  STAT* If patient is at the pharmacy, call can be transferred to refill team.   1. Which medications need to be refilled? (please list name of each medication and dose if known) nitroGLYCERIN (NITROSTAT) 0.4 MG   2. Which pharmacy/location (including street and city if local pharmacy) is medication to be sent to?  [id number] - , [place] - [phone] (Phone) [phone] (Fax)    3. Do they need a 30 day or 90 day supply? 90 day

## 2019-04-04 NOTE — Telephone Encounter (Signed)
Rx for NTG sent to pharmacy as requested.

## 2019-05-21 ENCOUNTER — Ambulatory Visit: Payer: Medicare HMO | Admitting: Cardiology

## 2019-09-21 ENCOUNTER — Other Ambulatory Visit: Payer: Self-pay | Admitting: Cardiology

## 2019-11-14 ENCOUNTER — Ambulatory Visit: Payer: Medicare HMO | Admitting: Cardiology

## 2020-02-24 ENCOUNTER — Other Ambulatory Visit: Payer: Self-pay

## 2020-02-25 ENCOUNTER — Ambulatory Visit (INDEPENDENT_AMBULATORY_CARE_PROVIDER_SITE_OTHER): Payer: Medicare HMO | Admitting: Cardiology

## 2020-02-25 ENCOUNTER — Encounter: Payer: Self-pay | Admitting: Cardiology

## 2020-02-25 ENCOUNTER — Other Ambulatory Visit: Payer: Self-pay

## 2020-02-25 VITALS — BP 152/92 | HR 84 | Temp 97.2°F | Ht 69.5 in | Wt 289.0 lb

## 2020-02-25 DIAGNOSIS — E119 Type 2 diabetes mellitus without complications: Secondary | ICD-10-CM

## 2020-02-25 DIAGNOSIS — I10 Essential (primary) hypertension: Secondary | ICD-10-CM | POA: Diagnosis not present

## 2020-02-25 DIAGNOSIS — Z794 Long term (current) use of insulin: Secondary | ICD-10-CM

## 2020-02-25 DIAGNOSIS — I25119 Atherosclerotic heart disease of native coronary artery with unspecified angina pectoris: Secondary | ICD-10-CM

## 2020-02-25 DIAGNOSIS — E782 Mixed hyperlipidemia: Secondary | ICD-10-CM | POA: Diagnosis not present

## 2020-02-25 NOTE — Patient Instructions (Signed)

## 2020-02-25 NOTE — Progress Notes (Signed)
Cardiology Office Note:    Date:  02/25/2020   ID:  Danny Parrish, DOB [birth year], MRN [id number]  PCP:  Lacie Draft, NP  Cardiologist:  Shirlee More, MD    Referring MD: Lacie Draft, NP    ASSESSMENT:    1. Coronary artery disease involving native coronary artery of native heart with angina pectoris ([place])   2. Benign essential hypertension   3. Mixed hyperlipidemia   4. Type 2 diabetes mellitus without complication, with long-term current use of insulin (HCC)    PLAN:    In order of problems listed above:  1. Stable CAD New York Heart Association class I having no angina but he needs an ischemia evaluation advised ongoing medical therapy including his dual antiplatelet well-tolerated without GI side effects high intensity statin with LDL at target and beta-blocker. 2. Stable continue medical therapy 3. Continue his high intensity statin 4. Stable managed by his PCP   Next appointment: 1 year   Medication Adjustments/Labs and Tests Ordered: Current medicines are reviewed at length with the patient today.  Concerns regarding medicines are outlined above.  No orders of the defined types were placed in this encounter.  No orders of the defined types were placed in this encounter.   No chief complaint on file.   History of Present Illness:    Danny Parrish is a 69 y.o. male with a hx of  CAD, Dyslipidemia, HTN, and T2 DM  last seen 06/11/2017.  Coronary angiography was done in 2003  and a was treated medically with aspirin clopidogrel high intensity statin along with penile fibrin and metoprolol.  He did have PCI and stent [facility] 2001 proximal LAD and staged to the distal left circumflex and third marginal branch. Compliance with diet, lifestyle and medications: Yes  He has had no anginal discomfort in over 10 years he is quite pleased with the quality of his life tolerates dual antiplatelet therapy and high intensity statin and has no exercise  intolerance edema shortness of breath chest pain palpitation or syncope.  EKG today in the office shows sinus rhythm minor T wave abnormality.  He is committed to weight loss since lost 11 pounds recently blood pressure runs in range. Past Medical History:  Diagnosis Date  . Anxiety state 08/17/2010  . Benign essential hypertension 11/05/2015  . Constipation 08/17/2010  . Controlled substance agreement signed 11/30/2017  . Coronary artery disease involving native coronary artery of native heart with angina pectoris ([place]) 08/17/2010   Overview:  Multivessel on cath 2003, treated medically  . Drug-induced constipation 08/17/2010  . Essential hypertension 08/17/2010  . Hearing loss 08/17/2010  . Heart attack ([place])    2000  . Hereditary and idiopathic peripheral neuropathy 08/17/2010  . Hyperlipidemia   . Hypertension   . Morbid (severe) obesity due to excess calories ([place]) 11/05/2015   Formatting of this note might be different from the original. per last BMI in vitals on 08/31/2017  . Obesity 11/05/2015  . OSA (obstructive sleep apnea) 11/05/2015  . Pain syndrome, chronic 11/05/2015  . Strain of lumbar paraspinous muscle 08/31/2017  . Type 2 diabetes mellitus ([place]) 11/05/2015    Past Surgical History:  Procedure Laterality Date  . CARPAL TUNNEL RELEASE    . EYE SURGERY    . HAND SURGERY    . SHOULDER SURGERY      Current Medications: Current Meds  Medication Sig  . ACCU-CHEK COMPACT PLUS test strip   . ACCU-CHEK SOFTCLIX  LANCETS lancets   . acetic acid-hydrocortisone (VOSOL-HC) OTIC solution Place 3 drops into the right ear 2 (two) times daily.  Marland Kitchen aspirin EC 81 MG tablet Take 81 mg by mouth.  Marland Kitchen atorvastatin (LIPITOR) 40 MG tablet   . clopidogrel (PLAVIX) 75 MG tablet   . diclofenac (VOLTAREN) 75 MG EC tablet Take 75 mg by mouth 2 (two) times daily.  . fenofibrate (TRICOR) 145 MG tablet Take 1 tablet (145 mg total) by mouth daily.  . fluticasone (FLONASE) 50 MCG/ACT nasal  spray Place 2 sprays into both nostrils daily.  Marland Kitchen HYDROcodone-acetaminophen (NORCO/VICODIN) 5-325 MG tablet Take 1 tablet by mouth every 6 (six) hours as needed. For pain  . insulin aspart (NOVOLOG FLEXPEN) 100 UNIT/ML FlexPen Inject 0.4 Units into the skin in the morning, at noon, in the evening, and at bedtime.  . insulin detemir (LEVEMIR FLEXTOUCH) 100 UNIT/ML FlexPen Inject 0.5 mLs into the skin 2 (two) times daily.  Marland Kitchen liraglutide (VICTOZA) 18 MG/3ML SOPN Inject 1.8 mg into the skin daily.  Marland Kitchen LORazepam (ATIVAN) 1 MG tablet Take 2 tablets by mouth daily. Every morning and noon for taper  . LORazepam (ATIVAN) 2 MG tablet Take 1 tablet by mouth every evening. For taper  . metFORMIN (GLUCOPHAGE) 1000 MG tablet Take 1 tablet by mouth 2 (two) times daily with a meal.  . metoprolol tartrate (LOPRESSOR) 100 MG tablet Take 1 tablet by mouth 2 (two) times daily.  . naloxone (NARCAN) 0.4 MG/ML injection Inject 0.4 mg into the muscle as directed. For 1 dose  . nitroGLYCERIN (NITROSTAT) 0.4 MG SL tablet Place 1 tablet (0.4 mg total) under the tongue every 5 (five) minutes as needed for chest pain. FURTHER REFILL REQUIRE OFFICE VISIT.  Marland Kitchen ramipril (ALTACE) 2.5 MG capsule Take 1 capsule by mouth daily.  Marland Kitchen zolpidem (AMBIEN) 10 MG tablet Take 1 tablet by mouth at bedtime.     Allergies:   Patient has no known allergies.   Social History   Socioeconomic History  . Marital status: Married    Spouse name: Not on file  . Number of children: Not on file  . Years of education: Not on file  . Highest education level: Not on file  Occupational History  . Not on file  Tobacco Use  . Smoking status: Never Smoker  . Smokeless tobacco: Never Used  Substance and Sexual Activity  . Alcohol use: No    Alcohol/week: 0.0 standard drinks  . Drug use: No  . Sexual activity: Not on file  Other Topics Concern  . Not on file  Social History Narrative  . Not on file   Social Determinants of Health   Financial  Resource Strain:   . Difficulty of Paying Living Expenses:   Food Insecurity:   . Worried About Programme researcher, broadcasting/film/video in the Last Year:   . Barista in the Last Year:   Transportation Needs:   . Freight forwarder (Medical):   Marland Kitchen Lack of Transportation (Non-Medical):   Physical Activity:   . Days of Exercise per Week:   . Minutes of Exercise per Session:   Stress:   . Feeling of Stress :   Social Connections:   . Frequency of Communication with Friends and Family:   . Frequency of Social Gatherings with Friends and Family:   . Attends Religious Services:   . Active Member of Clubs or Organizations:   . Attends Banker Meetings:   Marland Kitchen Marital Status:  Family History: The patient's family history includes Alcohol abuse in his father; Heart attack in his father; Other in his paternal grandfather; Suicidality in his maternal grandfather. ROS:   Please see the history of present illness.    All other systems reviewed and are negative.  EKGs/Labs/Other Studies Reviewed:    The following studies were reviewed today:  EKG:  EKG ordered today and personally reviewed.  The ekg ordered today demonstrates sinus rhythm minor nonspecific T waves  Recent Labs: 11/18/2019: Hemoglobin A1c 6.9% 05/29/2019: CBC normal hemoglobin 14.6, CMP normal except for calcium mildly elevated 10.5 normal liver function GFR greater than 90 cc potassium 4.4 and lipid profile shows a total cholesterol 161 LDL 77 HDL 34 triglycerides 251.  Physical Exam:    VS:  BP (!) 152/92   Pulse 84   Temp (!) 97.2 F (36.2 C)   Ht 5' 9.5" (1.765 m)   Wt 289 lb (131.1 kg)   SpO2 94%   BMI 42.07 kg/m     Wt Readings from Last 3 Encounters:  02/25/20 289 lb (131.1 kg)  06/11/17 283 lb (128.4 kg)     GEN: He has gained weight well nourished, well developed in no acute distress HEENT: Normal NECK: No JVD; No carotid bruits LYMPHATICS: No lymphadenopathy CARDIAC: RRR, no murmurs, rubs,  gallops RESPIRATORY:  Clear to auscultation without rales, wheezing or rhonchi  ABDOMEN: Soft, non-tender, non-distended MUSCULOSKELETAL:  No edema; No deformity  SKIN: Warm and dry NEUROLOGIC:  Alert and oriented x 3 PSYCHIATRIC:  Normal affect    Signed, Norman Herrlich, MD  02/25/2020 3:32 PM    [facility]

## 2020-04-28 ENCOUNTER — Other Ambulatory Visit: Payer: Self-pay

## 2020-04-28 MED ORDER — NITROGLYCERIN 0.4 MG SL SUBL: 0.4000 mg | SUBLINGUAL_TABLET | SUBLINGUAL | 10 refills | Status: DC | PRN

## 2020-04-28 NOTE — Telephone Encounter (Signed)
[facility]

## 2020-04-29 ENCOUNTER — Other Ambulatory Visit: Payer: Self-pay

## 2020-05-26 DIAGNOSIS — Z9181 History of falling: Secondary | ICD-10-CM | POA: Insufficient documentation

## 2020-05-26 HISTORY — DX: History of falling: Z91.81

## 2021-01-04 DIAGNOSIS — Z6835 Body mass index (BMI) 35.0-35.9, adult: Secondary | ICD-10-CM | POA: Insufficient documentation

## 2021-01-04 HISTORY — DX: Morbid (severe) obesity due to excess calories: E66.01

## 2021-03-11 DIAGNOSIS — I10 Essential (primary) hypertension: Secondary | ICD-10-CM | POA: Insufficient documentation

## 2021-03-11 DIAGNOSIS — I219 Acute myocardial infarction, unspecified: Secondary | ICD-10-CM | POA: Insufficient documentation

## 2021-03-31 ENCOUNTER — Ambulatory Visit: Payer: Medicare HMO | Admitting: Cardiology

## 2021-05-18 ENCOUNTER — Encounter (INDEPENDENT_AMBULATORY_CARE_PROVIDER_SITE_OTHER): Payer: Self-pay

## 2021-05-18 ENCOUNTER — Ambulatory Visit (INDEPENDENT_AMBULATORY_CARE_PROVIDER_SITE_OTHER): Payer: Medicare HMO | Admitting: Cardiology

## 2021-05-18 ENCOUNTER — Encounter: Payer: Self-pay | Admitting: Cardiology

## 2021-05-18 ENCOUNTER — Other Ambulatory Visit: Payer: Self-pay

## 2021-05-18 VITALS — BP 138/74 | HR 76 | Ht 69.6 in | Wt 239.8 lb

## 2021-05-18 DIAGNOSIS — I25119 Atherosclerotic heart disease of native coronary artery with unspecified angina pectoris: Secondary | ICD-10-CM

## 2021-05-18 DIAGNOSIS — I10 Essential (primary) hypertension: Secondary | ICD-10-CM | POA: Diagnosis not present

## 2021-05-18 DIAGNOSIS — E119 Type 2 diabetes mellitus without complications: Secondary | ICD-10-CM

## 2021-05-18 DIAGNOSIS — E782 Mixed hyperlipidemia: Secondary | ICD-10-CM

## 2021-05-18 DIAGNOSIS — Z794 Long term (current) use of insulin: Secondary | ICD-10-CM

## 2021-05-18 MED ORDER — RAMIPRIL 2.5 MG PO CAPS: 2.5000 mg | ORAL_CAPSULE | Freq: Every day | ORAL | 3 refills | Status: DC

## 2021-05-18 MED ORDER — TELMISARTAN 40 MG PO TABS: 40.0000 mg | ORAL_TABLET | Freq: Every day | ORAL | 3 refills | Status: DC

## 2021-05-18 NOTE — Progress Notes (Signed)
BP rechecked and Dr. Dulce Sellar was made aware. Pt will keep his medication the same, trend BP for 2-3 weeks and then Dr. Dulce Sellar will decide how to precede. Pt has been made aware.

## 2021-05-18 NOTE — Progress Notes (Signed)
Cardiology Office Note:    Date:  05/18/2021   ID:  Danny Parrish, DOB 1951/02/09, MRN [id number]  PCP:  Danny Paschal, NP  Cardiologist:  Danny Herrlich, MD    Referring MD: Danny Paschal, NP    ASSESSMENT:    1. Benign essential hypertension   2. Coronary artery disease involving native coronary artery of native heart with angina pectoris (HCC)   3. Mixed hyperlipidemia   4. Type 2 diabetes mellitus without complication, with long-term current use of insulin (HCC)    PLAN:    In order of problems listed above:  BP is not at target, will switch from a low intensity ACE inhibitor to a potent ARB trend blood pressures send me a MyChart message in 2 to 3 weeks and if another agent is needed add calcium channel blocker amlodipine.  Goal less than 130 systolic Stable CAD no angina continue medical therapy including dual antiplatelet statin and beta-blocker. Stable lipids are at target continue his current treatment with a high intensity statin Well-controlled diabetes check A1c his request   Next appointment: 1 year   Medication Adjustments/Labs and Tests Ordered: Current medicines are reviewed at length with the patient today.  Concerns regarding medicines are outlined above.  No orders of the defined types were placed in this encounter.  No orders of the defined types were placed in this encounter.   Chief Complaint  Patient presents with   Follow-up   Coronary Artery Disease    History of Present Illness:    Danny Parrish is a 70 y.o. male with a hx of CAD dyslipidemia hypertension type 2 diabetes.  He has a history of PCI and stent [facility] 2001 proximal LAD and staged distal left circumflex and third marginal branch.  He was last seen 02/25/2020.  Compliance with diet, lifestyle and medications: Yes  He has lost 60 pounds purposefully.  His last A1c 6.3% request made to recheck today Blood pressure a month ago elevated 152/90 he is not checking at  home and today he has uncontrolled hypertension. He was transition from ACE inhibitor to a potent ARB trend blood pressures at home and send me a MyChart message in 3 weeks and he may require another agent like calcium channel blocker he will continue to sodium restrict. He has had no angina shortness of breath edema palpitation or syncope. Past Medical History:  Diagnosis Date   Anxiety state 08/17/2010   Benign essential hypertension 11/05/2015   Constipation 08/17/2010   Controlled substance agreement signed 11/30/2017   Coronary artery disease involving native coronary artery of native heart with angina pectoris (HCC) 08/17/2010   Overview:  Multivessel on cath 2003, treated medically   Drug-induced constipation 08/17/2010   Essential hypertension 08/17/2010   Hearing loss 08/17/2010   Heart attack (HCC)    2000   Hereditary and idiopathic peripheral neuropathy 08/17/2010   Hyperlipidemia    Hypertension    Morbid (severe) obesity due to excess calories (HCC) 11/05/2015   Formatting of this note might be different from the original. per last BMI in vitals on 08/31/2017   Obesity 11/05/2015   OSA (obstructive sleep apnea) 11/05/2015   Pain syndrome, chronic 11/05/2015   Strain of lumbar paraspinous muscle 08/31/2017   Type 2 diabetes mellitus (HCC) 11/05/2015    Past Surgical History:  Procedure Laterality Date   CARPAL TUNNEL RELEASE     EYE SURGERY     HAND SURGERY     SHOULDER  SURGERY      Current Medications: Current Meds  Medication Sig   aspirin EC 81 MG tablet Take 81 mg by mouth daily.   atorvastatin (LIPITOR) 40 MG tablet Take 40 mg by mouth daily.   clopidogrel (PLAVIX) 75 MG tablet Take 75 mg by mouth daily.   diclofenac (VOLTAREN) 75 MG EC tablet Take 75 mg by mouth 2 (two) times daily.   fenofibrate (TRICOR) 145 MG tablet Take 1 tablet (145 mg total) by mouth daily.   fluticasone (FLONASE) 50 MCG/ACT nasal spray Place 2 sprays into both nostrils daily.    hydrOXYzine (ATARAX/VISTARIL) 25 MG tablet Take 25 mg by mouth 3 (three) times daily as needed for anxiety.   liraglutide (VICTOZA) 18 MG/3ML SOPN Inject 1.8 mg into the skin daily.   LORazepam (ATIVAN) 1 MG tablet Take 2 tablets by mouth daily. Every morning and noon for taper   LORazepam (ATIVAN) 2 MG tablet Take 1 tablet by mouth every evening. For taper   metFORMIN (GLUCOPHAGE) 1000 MG tablet Take 1 tablet by mouth 2 (two) times daily with a meal.   metoprolol tartrate (LOPRESSOR) 100 MG tablet Take 1 tablet by mouth 2 (two) times daily.   naloxone (NARCAN) 0.4 MG/ML injection Inject 0.4 mg into the muscle as directed. For 1 dose   nitroGLYCERIN (NITROSTAT) 0.4 MG SL tablet Place 1 tablet (0.4 mg total) under the tongue every 5 (five) minutes as needed for chest pain. FURTHER REFILL REQUIRE OFFICE VISIT.   ramipril (ALTACE) 2.5 MG capsule Take 1 capsule by mouth daily.   zolpidem (AMBIEN) 10 MG tablet Take 10 mg by mouth as needed for sleep.     Allergies:   Patient has no known allergies.   Social History   Socioeconomic History   Marital status: Married    Spouse name: Not on file   Number of children: Not on file   Years of education: Not on file   Highest education level: Not on file  Occupational History   Not on file  Tobacco Use   Smoking status: Never   Smokeless tobacco: Never  Vaping Use   Vaping Use: Never used  Substance and Sexual Activity   Alcohol use: No    Alcohol/week: 0.0 standard drinks   Drug use: No   Sexual activity: Not on file  Other Topics Concern   Not on file  Social History Narrative   Not on file   Social Determinants of Health   Financial Resource Strain: Not on file  Food Insecurity: Not on file  Transportation Needs: Not on file  Physical Activity: Not on file  Stress: Not on file  Social Connections: Not on file     Family History: The patient's family history includes Alcohol abuse in his father; Heart attack in his father;  Other in his paternal grandfather; Suicidality in his maternal grandfather. ROS:   Please see the history of present illness.    All other systems reviewed and are negative.  EKGs/Labs/Other Studies Reviewed:    The following studies were reviewed today:  EKG:  EKG ordered today and personally reviewed.  The ekg ordered today demonstrates sinus rhythm LVH voltage nonspecific T waves  Recent Labs: 04/28/2021 cholesterol 149 LDL 91 triglycerides 122 HDL 34 CMP normal except creatinine 1.22 GFR 64 cc Hemoglobin 15.1 Physical Exam:    VS:  BP (!) 163/93   Pulse 76   Ht 5' 9.6" (1.768 m)   Wt 239 lb 12.8 oz (108.8 kg)  SpO2 96%   BMI 34.80 kg/m     Wt Readings from Last 3 Encounters:  05/18/21 239 lb 12.8 oz (108.8 kg)  02/25/20 289 lb (131.1 kg)  06/11/17 283 lb (128.4 kg)     GEN:  Well nourished, well developed in no acute distress HEENT: Normal NECK: No JVD; No carotid bruits LYMPHATICS: No lymphadenopathy CARDIAC: RRR, no murmurs, rubs, gallops RESPIRATORY:  Clear to auscultation without rales, wheezing or rhonchi  ABDOMEN: Soft, non-tender, non-distended MUSCULOSKELETAL:  No edema; No deformity  SKIN: Warm and dry NEUROLOGIC:  Alert and oriented x 3 PSYCHIATRIC:  Normal affect    Signed, Danny Herrlich, MD  05/18/2021 2:26 PM    [facility]

## 2021-05-18 NOTE — Patient Instructions (Addendum)
Medication Instructions:  Your physician recommends that you continue on your current medications as directed. Please refer to the Current Medication list given to you today.  *If you need a refill on your cardiac medications before your next appointment, please call your pharmacy*   Lab Work: Your physician recommends that you have a hgb A1C done today in the office.  If you have labs (blood work) drawn today and your tests are completely normal, you will receive your results only by: MyChart Message (if you have MyChart) OR A paper copy in the mail If you have any lab test that is abnormal or we need to change your treatment, we will call you to review the results.   Testing/Procedures: None ordered   Follow-Up: At [facility], you and your health needs are our priority.  As part of our continuing mission to provide you with exceptional heart care, we have created designated Provider Care Teams.  These Care Teams include your primary Cardiologist (physician) and Advanced Practice Providers (APPs -  Physician Assistants and Nurse Practitioners) who all work together to provide you with the care you need, when you need it.  We recommend signing up for the patient portal called "MyChart".  Sign up information is provided on this After Visit Summary.  MyChart is used to connect with patients for Virtual Visits (Telemedicine).  Patients are able to view lab/test results, encounter notes, upcoming appointments, etc.  Non-urgent messages can be sent to your provider as well.   To learn more about what you can do with MyChart, go to [email or web address].    Your next appointment:   12 month(s)  The format for your next appointment:   In Person  Provider:   Norman Herrlich, MD   Other Instructions NA

## 2021-05-19 LAB — HEMOGLOBIN A1C
Est. average glucose Bld gHb Est-mCnc: 140 mg/dL
Hgb A1c MFr Bld: 6.5 % — ABNORMAL HIGH (ref 4.8–5.6)

## 2021-05-26 ENCOUNTER — Other Ambulatory Visit: Payer: Self-pay | Admitting: Cardiology

## 2021-06-23 ENCOUNTER — Ambulatory Visit: Payer: Medicare HMO | Admitting: Podiatry

## 2021-07-14 ENCOUNTER — Ambulatory Visit: Payer: Medicare HMO | Admitting: Podiatry

## 2021-07-25 ENCOUNTER — Other Ambulatory Visit: Payer: Self-pay

## 2021-07-25 ENCOUNTER — Ambulatory Visit (INDEPENDENT_AMBULATORY_CARE_PROVIDER_SITE_OTHER): Payer: Medicare HMO | Admitting: Podiatry

## 2021-07-25 DIAGNOSIS — M79676 Pain in unspecified toe(s): Secondary | ICD-10-CM

## 2021-07-25 DIAGNOSIS — L600 Ingrowing nail: Secondary | ICD-10-CM

## 2021-07-25 DIAGNOSIS — L03032 Cellulitis of left toe: Secondary | ICD-10-CM | POA: Diagnosis not present

## 2021-07-25 NOTE — Progress Notes (Signed)
  Subjective:  Patient ID: Danny Parrish, male    DOB: 1951-04-28,  MRN: [id number]  Chief Complaint  Patient presents with   Nail Problem    Lt hallux lateral border x 4-5 mo - really no pain only soreness with shoes on - w/ redness,swelling and bleeding - no warmth/hot to touch - Tx: neosprorin, silver and 1 round of abx    Diabetes    FBS: 123 a1C: 6.3 CPP: Lucendia Herrlich x June 23    70 y.o. male presents with the above complaint. History confirmed with patient.   Objective:  Physical Exam: warm, good capillary refill, no trophic changes or ulcerative lesions, normal DP and PT pulses, and normal sensory exam.  Painful ingrowing nail at lateral border of the left, hallux; local warmth noted, local erythema noted, and serosanguinous drainage noted  Assessment:   1. Ingrown nail   2. Pain around toenail   3. Paronychia, toe, left      Plan:  Patient was evaluated and treated and all questions answered.  Ingrown Nail, left -Patient elects to proceed with ingrown toenail removal today -Ingrown nail excised. See procedure note. -Educated on post-procedure care including soaking. Written instructions provided.  Procedure: Excision of paronychia Location: Left 1st toe lateral border Anesthesia: Lidocaine 1% plain; 2mL, digital block. Skin Prep: Betadine. Dressing: Silvadene; telfa; dry, sterile, compression dressing. Technique: Following skin prep, the toe was exsanguinated and a tourniquet was secured at the base of the toe. The affected nail border was freed, split with a nail splitter, and excised. Granuloma sharply excised with tissue nipper. Sterile dressing applied. Disposition: Patient tolerated procedure well. Patient to return in 2 weeks for follow-up.

## 2021-07-25 NOTE — Patient Instructions (Signed)

## 2022-09-10 ENCOUNTER — Other Ambulatory Visit: Payer: Self-pay | Admitting: Cardiology

## 2023-06-28 DIAGNOSIS — G4730 Sleep apnea, unspecified: Secondary | ICD-10-CM

## 2023-06-28 DIAGNOSIS — F1190 Opioid use, unspecified, uncomplicated: Secondary | ICD-10-CM | POA: Insufficient documentation

## 2023-06-28 DIAGNOSIS — F3341 Major depressive disorder, recurrent, in partial remission: Secondary | ICD-10-CM | POA: Insufficient documentation

## 2023-06-28 HISTORY — DX: Opioid use, unspecified, uncomplicated: F11.90

## 2023-06-28 HISTORY — DX: Major depressive disorder, recurrent, in partial remission: F33.41

## 2023-06-28 HISTORY — DX: Insomnia, unspecified: G47.30

## 2023-08-29 ENCOUNTER — Other Ambulatory Visit: Payer: Self-pay

## 2023-08-29 ENCOUNTER — Ambulatory Visit: Payer: Medicare HMO | Attending: Cardiology | Admitting: Cardiology

## 2023-08-29 VITALS — BP 134/76 | HR 81 | Ht 69.5 in | Wt 219.6 lb

## 2023-08-29 DIAGNOSIS — I25119 Atherosclerotic heart disease of native coronary artery with unspecified angina pectoris: Secondary | ICD-10-CM | POA: Diagnosis not present

## 2023-08-29 DIAGNOSIS — E782 Mixed hyperlipidemia: Secondary | ICD-10-CM | POA: Diagnosis not present

## 2023-08-29 DIAGNOSIS — Z794 Long term (current) use of insulin: Secondary | ICD-10-CM | POA: Insufficient documentation

## 2023-08-29 DIAGNOSIS — I10 Essential (primary) hypertension: Secondary | ICD-10-CM | POA: Diagnosis not present

## 2023-08-29 HISTORY — DX: Type 2 diabetes mellitus with diabetic neuropathy, unspecified: Z79.4

## 2023-08-29 MED ORDER — NITROGLYCERIN 0.4 MG SL SUBL: 0.4000 mg | SUBLINGUAL_TABLET | SUBLINGUAL | 1 refills | Status: DC | PRN

## 2023-08-29 NOTE — Patient Instructions (Signed)
Medication Instructions:  Your physician recommends that you continue on your current medications as directed. Please refer to the Current Medication list given to you today.  *If you need a refill on your cardiac medications before your next appointment, please call your pharmacy*   Lab Work: None If you have labs (blood work) drawn today and your tests are completely normal, you will receive your results only by: MyChart Message (if you have MyChart) OR A paper copy in the mail If you have any lab test that is abnormal or we need to change your treatment, we will call you to review the results.   Testing/Procedures:   [facility] [address] [place], [place] [address] Phone:  [phone]    Please arrive 15 minutes prior to your appointment time for registration and insurance purposes.  The test will take approximately 3 to 4 hours to complete; you may bring reading material.  If someone comes with you to your appointment, they will need to remain in the main lobby due to limited space in the testing area. **If you are pregnant or breastfeeding, please notify the nuclear lab prior to your appointment**  How to prepare for your Myocardial Perfusion Test: Do not eat or drink 3 hours prior to your test, except you may have water. Do not consume products containing caffeine (regular or decaffeinated) 12 hours prior to your test. (ex: coffee, chocolate, sodas, tea). Do bring a list of your current medications with you.  If not listed below, you may take your medications as normal. HOLD diabetic medication/insulin the morning of the test: Metformin, Take half of long acting insulin the night before test: Lantus Do wear comfortable clothes (no dresses or overalls) and walking shoes, tennis shoes preferred (No heels or open toe shoes are allowed). Do NOT wear cologne, perfume, aftershave, or lotions (deodorant is allowed). If these instructions are not  followed, your test will have to be rescheduled.  Please report to [address] for your test.  If you have questions or concerns about your appointment, you can call the [facility] at [phone].  If you cannot keep your appointment, please provide 24 hours notification to the Nuclear Lab, to avoid a possible $50 charge to your account.    Follow-Up: At [facility], you and your health needs are our priority.  As part of our continuing mission to provide you with exceptional heart care, we have created designated Provider Care Teams.  These Care Teams include your primary Cardiologist (physician) and Advanced Practice Providers (APPs -  Physician Assistants and Nurse Practitioners) who all work together to provide you with the care you need, when you need it.  We recommend signing up for the patient portal called "MyChart".  Sign up information is provided on this After Visit Summary.  MyChart is used to connect with patients for Virtual Visits (Telemedicine).  Patients are able to view lab/test results, encounter notes, upcoming appointments, etc.  Non-urgent messages can be sent to your provider as well.   To learn more about what you can do with MyChart, go to [email or web address].    Your next appointment:   1 year(s)  Provider:   Norman Herrlich, MD    Other Instructions None

## 2023-08-29 NOTE — Progress Notes (Signed)
Cardiology Office Note:    Date:  08/29/2023   ID:  Danny Parrish, DOB 1951/04/04, MRN [id number]  PCP:  Danny Paschal, NP  Cardiologist:  Danny Herrlich, MD    Referring MD: Danny Paschal, NP    ASSESSMENT:    1. Coronary artery disease involving native coronary artery of native heart with angina pectoris (HCC)   2. Essential hypertension   3. Mixed hyperlipidemia    PLAN:    In order of problems listed above:  [name] has a history of CAD remote multivessel PCI and is having angina.  For further evaluation myocardial perfusion study and if high risk findings extensive ischemia would benefit from repeat coronary angiography and perhaps percutaneous intervention He is on good medical regimen continue his long-term aspirin Plavix high intensity statin and beta-blocker If frequent episodes we can place him on oral mononitrate Blood pressure at target on current treatment continue his ACE inhibitor Continue statin LDL is at target   Next appointment: 1 year   Medication Adjustments/Labs and Tests Ordered: Current medicines are reviewed at length with the patient today.  Concerns regarding medicines are outlined above.  Orders Placed This Encounter  Procedures   EKG 12-Lead   No orders of the defined types were placed in this encounter.    History of Present Illness:    Danny Parrish is a 72 y.o. male with a hx of CAD with PCI and stent 2001 proximal mLAD distal left circumflex and third marginal branches staged procedure dyslipidemia hypertension type 2 diabetes.  He was last seen 05/18/2021. 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 to be very fatigued and tired and sometimes in the day he has very mild chest tightness has not needed nitroglycerin relieved with rest not happening frequently With his history of CAD he is concerned and inquires if he needs further evaluation He is not having exertional  angina edema shortness of breath palpitation or syncope He continues on chronic dual antiplatelet therapy and tolerates his statin without muscle pain or weakness Past Medical History:  Diagnosis Date   Anxiety state 08/17/2010   Benign essential hypertension 11/05/2015   Chronic, continuous use of opioids 06/28/2023   Class 2 severe obesity due to excess calories with serious comorbidity and body mass index (BMI) of 35.0 to 35.9 in adult [facility]) 01/04/2021   Constipation 08/17/2010   Controlled substance agreement signed 11/30/2017   Coronary artery disease involving native coronary artery of native heart with angina pectoris (HCC) 08/17/2010   Overview:  Multivessel on cath 2003, treated medically   Drug-induced constipation 08/17/2010   Essential hypertension 08/17/2010   Hearing loss 08/17/2010   Heart attack (HCC)    2000   Hereditary and idiopathic peripheral neuropathy 08/17/2010   Hyperlipidemia    Hypertension    Insomnia with sleep apnea 06/28/2023   Major depressive disorder, recurrent, in partial remission (HCC) 06/28/2023   Morbid (severe) obesity due to excess calories (HCC) 11/05/2015   Formatting of this note might be different from the original. per last BMI in vitals on 08/31/2017   Obesity 11/05/2015   OSA (obstructive sleep apnea) 11/05/2015   Pain syndrome, chronic 11/05/2015   Risk for falls 05/26/2020   Strain of lumbar paraspinous muscle 08/31/2017   Type 2 diabetes mellitus (HCC) 11/05/2015   Type 2 diabetes mellitus with diabetic neuropathy, with long-term current use of insulin (HCC) 08/29/2023    Current  Medications: Current Meds  Medication Sig   aspirin EC 81 MG tablet Take 81 mg by mouth daily.   atorvastatin (LIPITOR) 40 MG tablet Take 40 mg by mouth daily.   B-D UF III MINI PEN NEEDLES 31G X 5 MM MISC Inject 1 each into the skin 4 (four) times daily.   clopidogrel (PLAVIX) 75 MG tablet Take 75 mg by mouth daily.   diclofenac (VOLTAREN) 75 MG EC  tablet Take 75 mg by mouth 2 (two) times daily.   fenofibrate (TRICOR) 145 MG tablet Take 1 tablet (145 mg total) by mouth daily.   HYDROcodone-acetaminophen (NORCO/VICODIN) 5-325 MG tablet Take 1 tablet by mouth every 6 (six) hours as needed for moderate pain (pain score 4-6) or severe pain (pain score 7-10).   hydrOXYzine (ATARAX/VISTARIL) 25 MG tablet Take 1 tablet by mouth 3 (three) times daily as needed for anxiety.   insulin aspart (NOVOLOG) 100 UNIT/ML injection Inject 30 Units into the skin as needed for high blood sugar (Scale).   insulin glargine (LANTUS) 100 UNIT/ML injection Inject 30 Units into the skin 2 (two) times daily.   metFORMIN (GLUCOPHAGE) 1000 MG tablet Take 1 tablet by mouth 2 (two) times daily with a meal.   metoprolol tartrate (LOPRESSOR) 100 MG tablet Take 1 tablet by mouth 2 (two) times daily.   nitroGLYCERIN (NITROSTAT) 0.4 MG SL tablet PLACE 1 TABLET (0.4 MG TOTAL) UNDER THE TONGUE EVERY 5 (FIVE) MINUTES AS NEEDED FOR CHEST PAIN. FURTHER REFILL REQUIRE OFFICE VISIT.   ramipril (ALTACE) 10 MG capsule Take 20 mg by mouth daily.   Semaglutide (OZEMPIC, 1 MG/DOSE, [place]) Inject 1 mg into the skin once a week.   zolpidem (AMBIEN) 10 MG tablet Take 10 mg by mouth as needed for sleep.   [DISCONTINUED] diclofenac (VOLTAREN) 75 MG EC tablet Take 1 tablet by mouth 2 (two) times daily.   [DISCONTINUED] fluticasone (FLONASE) 50 MCG/ACT nasal spray Place 2 sprays into both nostrils daily.   [DISCONTINUED] liraglutide (VICTOZA) 18 MG/3ML SOPN Inject 1.8 mg into the skin daily.   [DISCONTINUED] ramipril (ALTACE) 2.5 MG capsule Take 1 capsule (2.5 mg total) by mouth daily.      EKGs/Labs/Other Studies Reviewed:    The following studies were reviewed today:  Recent labs 12/28/2022 hemoglobin 14.2 platelets 346,000 creatinine 1.55 GFR 48 cc/min potassium 5.1 sodium 138 Cholesterol 150 LDL 93 glycerides 138 HDL 30 EKG Interpretation Date/Time:  Wednesday August 29 2023 14:05:57  EDT Ventricular Rate:  78 PR Interval:  152 QRS Duration:  90 QT Interval:  364 QTC Calculation: 414 R Axis:   39  Text Interpretation: Normal sinus rhythm Normal ECG When compared with ECG of 09-Mar-2008 12:43, No significant change was found Confirmed by Danny Parrish ([id number]) on 08/29/2023 2:09:14 PM    Physical Exam:    VS:  BP 134/76 (BP Location: Right Arm, Patient Position: Sitting)   Pulse 81   Ht 5' 9.5" (1.765 m)   Wt 219 lb 9.6 oz (99.6 kg)   SpO2 99%   BMI 31.96 kg/m     Wt Readings from Last 3 Encounters:  08/29/23 219 lb 9.6 oz (99.6 kg)  05/18/21 239 lb 12.8 oz (108.8 kg)  02/25/20 289 lb (131.1 kg)     GEN:  Well nourished, well developed in no acute distress HEENT: Normal NECK: No JVD; No carotid bruits LYMPHATICS: No lymphadenopathy CARDIAC: RRR, no murmurs, rubs, gallops RESPIRATORY:  Clear to auscultation without rales, wheezing or rhonchi  ABDOMEN: Soft, non-tender,  non-distended MUSCULOSKELETAL:  No edema; No deformity  SKIN: Warm and dry NEUROLOGIC:  Alert and oriented x 3 PSYCHIATRIC:  Normal affect    Signed, Danny Herrlich, MD  08/29/2023 2:22 PM    [facility]

## 2023-08-29 NOTE — Addendum Note (Signed)
Addended by: Roxanne Mins I on: 08/29/2023 02:40 PM   Modules accepted: Orders

## 2023-09-04 ENCOUNTER — Ambulatory Visit: Payer: Medicare HMO | Attending: Cardiology

## 2023-09-04 DIAGNOSIS — I10 Essential (primary) hypertension: Secondary | ICD-10-CM | POA: Diagnosis not present

## 2023-09-04 DIAGNOSIS — I25119 Atherosclerotic heart disease of native coronary artery with unspecified angina pectoris: Secondary | ICD-10-CM

## 2023-09-04 DIAGNOSIS — E782 Mixed hyperlipidemia: Secondary | ICD-10-CM

## 2023-09-04 MED ORDER — REGADENOSON 0.4 MG/5ML IV SOLN
0.4000 mg | Freq: Once | INTRAVENOUS | Status: AC
  Administered 2023-09-04: 0.4 mg via INTRAVENOUS

## 2023-09-04 MED ORDER — TECHNETIUM TC 99M TETROFOSMIN IV KIT
8.9000 | PACK | Freq: Once | INTRAVENOUS | Status: AC | PRN
  Administered 2023-09-04: 8.9 via INTRAVENOUS

## 2023-09-04 MED ORDER — TECHNETIUM TC 99M TETROFOSMIN IV KIT
24.9000 | PACK | Freq: Once | INTRAVENOUS | Status: AC | PRN
  Administered 2023-09-04: 24.9 via INTRAVENOUS

## 2023-09-05 LAB — MYOCARDIAL PERFUSION IMAGING
LV dias vol: 104 mL (ref 62–150)
LV sys vol: 57 mL
Nuc Stress EF: 45 %
Peak HR: 91 {beats}/min
Rest HR: 71 {beats}/min
Rest Nuclear Isotope Dose: 8.9 mCi
SDS: 5
SRS: 7
SSS: 12
Stress Nuclear Isotope Dose: 24.9 mCi
TID: 1.15

## 2023-09-07 ENCOUNTER — Telehealth: Payer: Self-pay | Admitting: Cardiology

## 2023-09-07 NOTE — Telephone Encounter (Signed)
Patient called to follow-up on test results. 

## 2023-09-10 ENCOUNTER — Other Ambulatory Visit: Payer: Self-pay

## 2023-09-10 DIAGNOSIS — I25119 Atherosclerotic heart disease of native coronary artery with unspecified angina pectoris: Secondary | ICD-10-CM

## 2023-09-10 NOTE — Telephone Encounter (Signed)
Patient informed of results.  

## 2023-10-16 ENCOUNTER — Ambulatory Visit: Payer: Medicare HMO | Attending: Cardiology

## 2023-10-16 DIAGNOSIS — I25119 Atherosclerotic heart disease of native coronary artery with unspecified angina pectoris: Secondary | ICD-10-CM

## 2023-10-16 LAB — ECHOCARDIOGRAM COMPLETE
Area-P 1/2: 3.5 cm2
MV M vel: 3.59 m/s
MV Peak grad: 51.6 mm[Hg]
S' Lateral: 3.6 cm

## 2023-10-22 ENCOUNTER — Other Ambulatory Visit: Payer: Self-pay | Admitting: Cardiology

## 2023-11-07 DEATH — deceased
